# Patient Record
Sex: Male | Born: 1990 | Race: White | Hispanic: No | Marital: Single | Smoking: Current every day smoker
Health system: Southern US, Community
[De-identification: ages and names within clinical notes are randomized; demographics above are authoritative.]

---
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
## 2010-07-04 LAB — URINALYSIS, ROUTINE W REFLEX MICROSCOPIC
Hgb urine dipstick: NEGATIVE
Nitrite: NEGATIVE
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
## 2011-01-22 LAB — DIFFERENTIAL
Basophils Absolute: 0 10*3/uL (ref 0.0–0.1)
Eosinophils Relative: 0 % (ref 0–5)
Lymphocytes Relative: 13 % — ABNORMAL LOW (ref 24–48)
Lymphs Abs: 1.7 10*3/uL (ref 1.1–4.8)
Monocytes Absolute: 0.8 10*3/uL (ref 0.2–1.2)
Monocytes Relative: 6 % (ref 3–11)
Neutro Abs: 10.2 10*3/uL — ABNORMAL HIGH (ref 1.7–8.0)

## 2011-01-22 LAB — BASIC METABOLIC PANEL
BUN: 9 mg/dL (ref 6–23)
Calcium: 9.1 mg/dL (ref 8.4–10.5)
Chloride: 107 mEq/L (ref 96–112)
Creatinine, Ser: 0.77 mg/dL (ref 0.4–1.5)
Glucose, Bld: 88 mg/dL (ref 70–99)
Potassium: 4.1 mEq/L (ref 3.5–5.1)
Sodium: 141 mEq/L (ref 135–145)

## 2011-01-22 LAB — CBC
HCT: 42.1 % (ref 36.0–49.0)
MCHC: 33.7 g/dL (ref 31.0–37.0)
WBC: 12.7 10*3/uL (ref 4.5–13.5)

## 2011-01-22 LAB — TYPE AND SCREEN: ABO/RH(D): A POS

## 2012-01-19 ENCOUNTER — Emergency Department (HOSPITAL_COMMUNITY)
Admission: EM | Admit: 2012-01-19 | Discharge: 2012-01-19 | Disposition: A | Discharge status: Home / Self Care | Payer: Self-pay | Attending: Emergency Medicine | Admitting: Emergency Medicine

## 2012-01-19 ENCOUNTER — Encounter (HOSPITAL_COMMUNITY): Payer: Self-pay | Admitting: Emergency Medicine

## 2012-01-19 DIAGNOSIS — F17200 Nicotine dependence, unspecified, uncomplicated: Secondary | ICD-10-CM | POA: Insufficient documentation

## 2012-01-19 DIAGNOSIS — X500XXA Overexertion from strenuous movement or load, initial encounter: Secondary | ICD-10-CM | POA: Insufficient documentation

## 2012-01-19 DIAGNOSIS — S29019A Strain of muscle and tendon of unspecified wall of thorax, initial encounter: Secondary | ICD-10-CM

## 2012-01-19 DIAGNOSIS — S239XXA Sprain of unspecified parts of thorax, initial encounter: Secondary | ICD-10-CM | POA: Insufficient documentation

## 2012-01-19 MED ORDER — HYDROCODONE-ACETAMINOPHEN 5-325 MG PO TABS: 1.0000 | ORAL_TABLET | Freq: Four times a day (QID) | ORAL | Status: AC | PRN

## 2012-01-19 MED ORDER — CYCLOBENZAPRINE HCL 10 MG PO TABS: ORAL_TABLET | ORAL | Status: DC

## 2012-01-19 MED ORDER — CYCLOBENZAPRINE HCL 10 MG PO TABS
10.0000 mg | ORAL_TABLET | Freq: Once | ORAL | Status: AC
  Administered 2012-01-19: 10 mg via ORAL
  Filled 2012-01-19: qty 1

## 2012-01-19 MED ORDER — IBUPROFEN 800 MG PO TABS
800.0000 mg | ORAL_TABLET | Freq: Once | ORAL | Status: AC
  Administered 2012-01-19: 800 mg via ORAL
  Filled 2012-01-19: qty 1

## 2012-01-19 MED ORDER — HYDROCODONE-ACETAMINOPHEN 5-325 MG PO TABS
1.0000 | ORAL_TABLET | Freq: Once | ORAL | Status: AC
  Administered 2012-01-19: 1 via ORAL
  Filled 2012-01-19: qty 1

## 2012-01-19 NOTE — ED Notes (Signed)
Pt c/o mid-lower back pain that began last night. Pt was working on his car when he bent over and heard a "pop" in his back. Pt states the pain was initially moderate and he "shook it off". Pt states this morning pain was "severe" and becomes worse with movement. Pt describes pain a "sharp" and "spasms".

## 2012-01-19 NOTE — ED Notes (Signed)
Patient states he felt something "pop" when he was working on a car last night. Complaining of back pain in the center of his back.
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
## 2012-01-20 NOTE — ED Provider Notes (Signed)
Medical screening examination/treatment/procedure(s) were performed by non-physician practitioner and as supervising physician I was immediately available for consultation/collaboration.   Gunnard Dorrance W. Eriona Kinchen, MD 01/20/12 1602 

## 2012-07-25 ENCOUNTER — Emergency Department (HOSPITAL_COMMUNITY)
Admission: EM | Admit: 2012-07-25 | Discharge: 2012-07-25 | Discharge status: Against Medical Advice | Payer: Self-pay | Attending: Emergency Medicine | Admitting: Emergency Medicine

## 2012-07-25 ENCOUNTER — Encounter (HOSPITAL_COMMUNITY): Payer: Self-pay | Admitting: Emergency Medicine

## 2012-07-25 DIAGNOSIS — R197 Diarrhea, unspecified: Secondary | ICD-10-CM | POA: Insufficient documentation

## 2012-07-25 LAB — URINE MICROSCOPIC-ADD ON

## 2012-07-25 LAB — URINALYSIS, ROUTINE W REFLEX MICROSCOPIC: Nitrite: NEGATIVE

## 2012-07-25 NOTE — ED Notes (Signed)
PT. REPORTS MULTIPLE EPISODES OF VOMITTING AND DIARRHEA THIS EVENING WITH UPPER ABDOMINAL CRAMPING / HEADACHE.

## 2013-04-11 ENCOUNTER — Emergency Department (HOSPITAL_BASED_OUTPATIENT_CLINIC_OR_DEPARTMENT_OTHER)
Admission: EM | Admit: 2013-04-11 | Discharge: 2013-04-11 | Disposition: A | Discharge status: Home / Self Care | Payer: Self-pay | Attending: Emergency Medicine | Admitting: Emergency Medicine

## 2013-04-11 ENCOUNTER — Encounter (HOSPITAL_BASED_OUTPATIENT_CLINIC_OR_DEPARTMENT_OTHER): Payer: Self-pay | Admitting: Emergency Medicine

## 2013-04-11 DIAGNOSIS — F17200 Nicotine dependence, unspecified, uncomplicated: Secondary | ICD-10-CM | POA: Insufficient documentation

## 2013-04-11 DIAGNOSIS — R21 Rash and other nonspecific skin eruption: Secondary | ICD-10-CM | POA: Insufficient documentation

## 2013-04-11 DIAGNOSIS — L0291 Cutaneous abscess, unspecified: Secondary | ICD-10-CM

## 2013-04-11 DIAGNOSIS — Z792 Long term (current) use of antibiotics: Secondary | ICD-10-CM | POA: Insufficient documentation

## 2013-04-11 DIAGNOSIS — IMO0002 Reserved for concepts with insufficient information to code with codable children: Secondary | ICD-10-CM | POA: Insufficient documentation

## 2013-04-11 LAB — CBC WITH DIFFERENTIAL/PLATELET
Monocytes Absolute: 0.9 10*3/uL (ref 0.1–1.0)
Neutro Abs: 6.5 10*3/uL (ref 1.7–7.7)
Neutrophils Relative %: 66 % (ref 43–77)
RBC: 4.62 MIL/uL (ref 4.22–5.81)
WBC: 9.9 10*3/uL (ref 4.0–10.5)

## 2013-04-11 LAB — BASIC METABOLIC PANEL
Chloride: 100 mEq/L (ref 96–112)
Creatinine, Ser: 0.8 mg/dL (ref 0.50–1.35)
GFR calc non Af Amer: >90 mL/min (ref >90)
Glucose, Bld: 110 mg/dL — ABNORMAL HIGH (ref 70–99)
Potassium: 3.9 mEq/L (ref 3.5–5.1)

## 2013-04-11 MED ORDER — CLINDAMYCIN PHOSPHATE 900 MG/50ML IV SOLN
900.0000 mg | Freq: Once | INTRAVENOUS | Status: AC
  Administered 2013-04-11: 900 mg via INTRAVENOUS
  Filled 2013-04-11: qty 50

## 2013-04-11 MED ORDER — CLINDAMYCIN HCL 150 MG PO CAPS: 300.0000 mg | ORAL_CAPSULE | Freq: Four times a day (QID) | ORAL | Status: DC

## 2013-04-11 MED ORDER — HYDROCODONE-ACETAMINOPHEN 5-325 MG PO TABS: 1.0000 | ORAL_TABLET | Freq: Four times a day (QID) | ORAL | Status: AC | PRN

## 2013-04-11 NOTE — ED Notes (Signed)
Pt has cellulitis to right elbow x 5 days.  Noted redness, swelling, inflammation and tenderness to elbow area.

## 2013-04-11 NOTE — ED Notes (Signed)
Right elbow wound site cleansed and dressed with nonstick dressing and Kling wrap.

## 2013-04-12 NOTE — ED Provider Notes (Signed)
CSN: [id number]     Arrival date & time 04/11/13  1435 History   First MD Initiated Contact with Patient 04/11/13 1528     Chief Complaint  Patient presents with  . Cellulitis   (Consider location/radiation/quality/duration/timing/severity/associated sxs/prior Treatment) HPI  This is a 22 year old male who presents with redness and swelling of the right elbow. Patient states that he noted a pimple over the right elbow eye 5 days ago which she popped. He has had increasing redness and swelling over the elbow and up the right arm. He reports pain with range of motion. He denies any fevers or other symptoms.  No past medical history on file. No past surgical history on file. No family history on file. History  Substance Use Topics  . Smoking status: Current Every Day Smoker  . Smokeless tobacco: Not on file  . Alcohol Use: Yes     Comment: weekends    Review of Systems  Constitutional: Negative.  Negative for fever.  Respiratory: Negative.  Negative for chest tightness and shortness of breath.   Cardiovascular: Negative.  Negative for chest pain.  Gastrointestinal: Negative.  Negative for abdominal pain.  Genitourinary: Negative.  Negative for dysuria.  Skin: Positive for color change, rash and wound.  Neurological: Negative for headaches.  All other systems reviewed and are negative.    Allergies  Review of patient's allergies indicates no known allergies.  Home Medications   Current Outpatient Rx  Name  Route  Sig  Dispense  Refill  . clindamycin (CLEOCIN) 150 MG capsule   Oral   Take 2 capsules (300 mg total) by mouth 4 (four) times daily.   56 capsule   0   . HYDROcodone-acetaminophen (NORCO/VICODIN) 5-325 MG per tablet   Oral   Take 1-2 tablets by mouth every 6 (six) hours as needed.   10 tablet   0    BP 132/80  Pulse 88  Temp(Src) 99.1 F (37.3 C) (Oral)  Resp 18  Ht 5\' 7"  (1.702 m)  Wt 150 lb (68.04 kg)  BMI 23.49 kg/m2  SpO2 99% Physical Exam   Nursing note and vitals reviewed. Constitutional: He is oriented to person, place, and time. He appears well-developed and well-nourished. No distress.  HENT:  Head: Normocephalic and atraumatic.  Cardiovascular: Normal rate, regular rhythm and normal heart sounds.   No murmur heard. Pulmonary/Chest: Effort normal and breath sounds normal. No respiratory distress. He has no wheezes.  Abdominal: Soft. Bowel sounds are normal. There is no tenderness. There is no rebound.  Musculoskeletal: He exhibits no edema.  Focused examination of the right upper extremity reveals a pustule and fluctuation over the posterior aspect of the right elbow with associated erythema, there is mild streaking up the arm proximally, patient has full range of motion  Lymphadenopathy:    He has no cervical adenopathy.  Neurological: He is alert and oriented to person, place, and time.  Skin: Skin is warm and dry.  See musculoskeletal above  Psychiatric: He has a normal mood and affect.    ED Course  INCISION AND DRAINAGE Date/Time: 04/12/2013 2:40 PM Performed by: Ross Marcus, F Authorized by: Ross Marcus, F Consent: Verbal consent obtained. Risks and benefits: risks, benefits and alternatives were discussed Consent given by: patient Patient identity confirmed: verbally with patient Type: abscess Body area: upper extremity Location details: right elbow Anesthesia: local infiltration Local anesthetic: lidocaine 1% with epinephrine Anesthetic total: 5 ml Patient sedated: no Scalpel size: 11 Incision type: single straight Complexity:  simple Drainage: purulent Drainage amount: moderate Wound treatment: drain placed Packing material: 1/2 in iodoform gauze Comments: I&D performed of abscess over the right elbow, 1 cm incision made with evacuation of pus, patient tolerated procedure well, incision was packed   (including critical care time) Labs Review Labs Reviewed  BASIC METABOLIC PANEL -  Abnormal; Notable for the following:    Glucose, Bld 110 (*)    All other components within normal limits  CBC WITH DIFFERENTIAL   Imaging Review No results found.  EKG Interpretation   None       MDM   1. Cellulitis and abscess    Patient presents with an abscess of the right elbow with associated cellulitis. He is otherwise systemically well and nontoxic appearing. Patient was given IV clindamycin given evidence of severe lightheadedness and streaking up the right upper extremity. Abscess was I&D at the bedside with evacuation of pus. Abscess was packed. Have low suspicion at this time for septic joint or other cause of redness at the elbow given obvious abscess outside the joint. Patient was given strict return precautions. He will be given a course of clindamycin for outpatient management of cellulitis. He is to followup at urgent care on Friday for wound recheck. He should followup sooner if he has increasing redness, swelling, or fevers. Patient stated understanding.  After history, exam, and medical workup I feel the patient has been appropriately medically screened and is safe for discharge home. Pertinent diagnoses were discussed with the patient. Patient was given return precautions.    Shon Baton, MD 04/12/13 [id number]

## 2013-08-30 ENCOUNTER — Emergency Department (HOSPITAL_BASED_OUTPATIENT_CLINIC_OR_DEPARTMENT_OTHER)
Admission: EM | Admit: 2013-08-30 | Discharge: 2013-08-30 | Disposition: A | Discharge status: Home / Self Care | Payer: Self-pay | Attending: Emergency Medicine | Admitting: Emergency Medicine

## 2013-08-30 ENCOUNTER — Encounter (HOSPITAL_BASED_OUTPATIENT_CLINIC_OR_DEPARTMENT_OTHER): Payer: Self-pay | Admitting: Emergency Medicine

## 2013-08-30 ENCOUNTER — Emergency Department (HOSPITAL_BASED_OUTPATIENT_CLINIC_OR_DEPARTMENT_OTHER): Payer: Self-pay

## 2013-08-30 DIAGNOSIS — Z79899 Other long term (current) drug therapy: Secondary | ICD-10-CM | POA: Insufficient documentation

## 2013-08-30 DIAGNOSIS — S91319A Laceration without foreign body, unspecified foot, initial encounter: Secondary | ICD-10-CM

## 2013-08-30 DIAGNOSIS — Y9302 Activity, running: Secondary | ICD-10-CM | POA: Insufficient documentation

## 2013-08-30 DIAGNOSIS — W268XXA Contact with other sharp object(s), not elsewhere classified, initial encounter: Secondary | ICD-10-CM | POA: Insufficient documentation

## 2013-08-30 DIAGNOSIS — F17200 Nicotine dependence, unspecified, uncomplicated: Secondary | ICD-10-CM | POA: Insufficient documentation

## 2013-08-30 DIAGNOSIS — S91309A Unspecified open wound, unspecified foot, initial encounter: Secondary | ICD-10-CM | POA: Insufficient documentation

## 2013-08-30 DIAGNOSIS — Z792 Long term (current) use of antibiotics: Secondary | ICD-10-CM | POA: Insufficient documentation

## 2013-08-30 DIAGNOSIS — Y9289 Other specified places as the place of occurrence of the external cause: Secondary | ICD-10-CM | POA: Insufficient documentation

## 2013-08-30 MED ORDER — TETANUS-DIPHTH-ACELL PERTUSSIS 5-2.5-18.5 LF-MCG/0.5 IM SUSP
0.5000 mL | Freq: Once | INTRAMUSCULAR | Status: AC
  Administered 2013-08-30: 0.5 mL via INTRAMUSCULAR
  Filled 2013-08-30: qty 0.5

## 2013-08-30 MED ORDER — CEPHALEXIN 500 MG PO CAPS: 500.0000 mg | ORAL_CAPSULE | Freq: Four times a day (QID) | ORAL | Status: DC

## 2013-08-30 MED ORDER — HYDROCODONE-ACETAMINOPHEN 5-325 MG PO TABS: 1.0000 | ORAL_TABLET | ORAL | Status: AC | PRN

## 2013-08-30 MED ORDER — CEPHALEXIN 250 MG PO CAPS
500.0000 mg | ORAL_CAPSULE | Freq: Once | ORAL | Status: AC
  Administered 2013-08-30: 500 mg via ORAL
  Filled 2013-08-30: qty 2
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
## 2013-08-30 NOTE — Discharge Instructions (Signed)
Laceration Care, Adult °A laceration is a cut or lesion that goes through all layers of the skin and into the tissue just beneath the skin. °TREATMENT  °Some lacerations may not require closure. Some lacerations may not be able to be closed due to an increased risk of infection. It is important to see your caregiver as soon as possible after an injury to minimize the risk of infection and maximize the opportunity for successful closure. °If closure is appropriate, pain medicines may be given, if needed. The wound will be cleaned to help prevent infection. Your caregiver will use stitches (sutures), staples, wound glue (adhesive), or skin adhesive strips to repair the laceration. These tools bring the skin edges together to allow for faster healing and a better cosmetic outcome. However, all wounds will heal with a scar. Once the wound has healed, scarring can be minimized by covering the wound with sunscreen during the day for 1 full year. °HOME CARE INSTRUCTIONS  °For sutures or staples: °· Keep the wound clean and dry. °· If you were given a bandage (dressing), you should change it at least once a day. Also, change the dressing if it becomes wet or dirty, or as directed by your caregiver. °· Wash the wound with soap and water 2 times a day. Rinse the wound off with water to remove all soap. Pat the wound dry with a clean towel. °· After cleaning, apply a thin layer of the antibiotic ointment as recommended by your caregiver. This will help prevent infection and keep the dressing from sticking. °· You may shower as usual after the first 24 hours. Do not soak the wound in water until the sutures are removed. °· Only take over-the-counter or prescription medicines for pain, discomfort, or fever as directed by your caregiver. °· Get your sutures or staples removed as directed by your caregiver. °For skin adhesive strips: °· Keep the wound clean and dry. °· Do not get the skin adhesive strips wet. You may bathe  carefully, using caution to keep the wound dry. °· If the wound gets wet, pat it dry with a clean towel. °· Skin adhesive strips will fall off on their own. You may trim the strips as the wound heals. Do not remove skin adhesive strips that are still stuck to the wound. They will fall off in time. °For wound adhesive: °· You may briefly wet your wound in the shower or bath. Do not soak or scrub the wound. Do not swim. Avoid periods of heavy perspiration until the skin adhesive has fallen off on its own. After showering or bathing, gently pat the wound dry with a clean towel. °· Do not apply liquid medicine, cream medicine, or ointment medicine to your wound while the skin adhesive is in place. This may loosen the film before your wound is healed. °· If a dressing is placed over the wound, be careful not to apply tape directly over the skin adhesive. This may cause the adhesive to be pulled off before the wound is healed. °· Avoid prolonged exposure to sunlight or tanning lamps while the skin adhesive is in place. Exposure to ultraviolet light in the first year will darken the scar. °· The skin adhesive will usually remain in place for 5 to 10 days, then naturally fall off the skin. Do not pick at the adhesive film. °You may need a tetanus shot if: °· You cannot remember when you had your last tetanus shot. °· You have never had a tetanus   shot. If you get a tetanus shot, your arm may swell, get red, and feel warm to the touch. This is common and not a problem. If you need a tetanus shot and you choose not to have one, there is a rare chance of getting tetanus. Sickness from tetanus can be serious. SEEK MEDICAL CARE IF:   You have redness, swelling, or increasing pain in the wound.  You see a red line that goes away from the wound.  You have yellowish-white fluid (pus) coming from the wound.  You have a fever.  You notice a bad smell coming from the wound or dressing.  Your wound breaks open before or  after sutures have been removed.  You notice something coming out of the wound such as wood or glass.  Your wound is on your hand or foot and you cannot move a finger or toe. SEEK IMMEDIATE MEDICAL CARE IF:   Your pain is not controlled with prescribed medicine.  You have severe swelling around the wound causing pain and numbness or a change in color in your arm, hand, leg, or foot.  Your wound splits open and starts bleeding.  You have worsening numbness, weakness, or loss of function of any joint around or beyond the wound.  You develop painful lumps near the wound or on the skin anywhere on your body. MAKE SURE YOU:   Understand these instructions.  Will watch your condition.  Will get help right away if you are not doing well or get worse. Document Released: 04/05/2005 Document Revised: 06/28/2011 Document Reviewed: 09/29/2010 [facility] Patient Information 2014 [place], [place]. Tetanus and Diphtheria Vaccine Your caregiver has suggested that you receive an immunization to prevent tetanus (lockjaw) and diphtheria. Tetanus and diphtheria are serious and deadly infectious diseases of the past that have been nearly wiped out by modern immunizations. Td or DT vaccines (shots) are the immunizations given to help prevent these illnesses. Td is the medical term for a standard tetanus dose, small diphtheria dose. DT means both in standard doses. ABOUT THE DISEASES Tetanus (lockjaw) and diphtheria are serious diseases. Tetanus is caused by a germ that lives in the soil. It enters the body through a cut or wound, often caused by a nail or broken piece of glass. You cannot catch tetanus from another person. Diphtheria spreads when germs pass from an infected person to the nose or throat of others. Tetanus causes serious, painful spasms of all muscles. It can lead to:  "Locking" of the muscles of the jaw and throat, so the patient cannot open his or her mouth or swallow.  Damage to the heart  muscle. Diphtheria causes a thick coating in the nose, throat, or airway. It can lead to:  Breathing problems.  Kidney problems.  Heart failure.  Paralysis.  Death. ABOUT THE VACCINES  A vaccine is a shot (immunization) that can help prevent a disease. Vaccines have helped lower the rates of getting certain diseases. If people stopped getting vaccinated, more people would develop illnesses. These vaccines can be used in three ways:  As catch-up for people who did not get all their doses when they were children.  As a booster dose every 10 years.  For protection against tetanus infection, after a wound. Benefits of the vaccines Vaccination is the best way to protect against tetanus and diphtheria. Because of vaccination, there are fewer cases of these diseases. Cases are rare in children because most get a routine vaccination with DTP (Diphtheria, Tetanus, and Pertussis), DTaP (  Diphtheria, Tetanus, and acellular Pertussis), or DT (Diphtheria and Tetanus) vaccines. There would be many more cases if we stopped vaccinating people. Tetanus kills about 1 in 5 people who are infected. WHEN SHOULD YOU GET TD VACCINE?  Td is made for people 67 years of age and older.  People who have not gotten at least 3 doses of any tetanus and diphtheria vaccine (DTP, DTaP or DT) during their lifetime should do so using Td. After a person gets the third dose, a Td dose is needed every 10 years all through life. This is because protection fades over time. Booster shots are needed every 10 years.  Other vaccines may be given at the same time as Td. You may not know today whether your immunizations are current. The vaccine given today is to protect you from your next cut or injury. It does not offer protection for the current injury. An immune globulin injection may be given, if protection is needed immediately. Check with your caregiver later regarding your immunization status. Tell your caregiver if the person  getting the vaccine:  Has ever had a serious allergic reaction or other problem with Td, or any other tetanus and diphtheria vaccine (DTP, DTaP, or DT). People who have had a serious allergic reaction should not receive the vaccine.  Has epilepsy or another nervous system illness.  Has had Guillain Barre Syndrome (GBS) in the past.  Now has a moderate or severe illness.  Is pregnant.  If you are not sure, ask your caregiver. WHAT ARE THE RISKS FROM TD VACCINE?  As with any medicine, there are very small risks that serious problems, even death, could occur after getting a vaccine. However, the risk of a serious side effect from the vaccine is almost zero.  The risks from the vaccine are much smaller than the risks from the diseases, if people stopped getting vaccinated. Both diseases can cause serious health problems, which are prevented by the vaccine.  Almost all people who get Td have no problems from it. Mild problems If mild problems occur, they usually start within hours to a day or two after vaccination. They may last 1-2 days:  Soreness, redness, or swelling where the shot was given.  Headache or tiredness.  Occasionally, a low grade fever. These problems can be worse in adults who get Td vaccine very often. Non-aspirin medicines may be used to reduce soreness. Severe problems These problems happen very rarely:  Serious allergic reaction (at most, occurs in 1 in 1 million vaccinated persons). This occurs almost immediately, and is treatable with medicines. Signs of a serious allergic reaction include:  Difficulty breathing.  Hoarseness or wheezing.  Hives.  Dizziness.  Deep, aching pain and muscle wasting in upper arm(s). Overall, the benefits to you and your family from these vaccines are far greater than the risk. WHAT TO DO IF THERE IS A SERIOUS REACTION:  Call a caregiver or get the person to a doctor or emergency room right away.  Write down what happened,  the date and time it happened, and tell your caregiver.  Ask your caregiver to file a Vaccine Adverse Event Report form or call, toll-free: [phone] If you want to learn more about this vaccine, ask your caregiver. She/he can give you the vaccine package insert or suggest other sources of information. Also, the [employer] gives compensation (payment) for persons thought to be injured by vaccines. For details call, toll-free: [phone]. Document Released: 04/02/2000 Document Revised:  06/28/2011 Document Reviewed: 02/20/2009 ExitCare Patient Information 2014 [place], [place].

## 2013-08-30 NOTE — ED Notes (Signed)
Injury to right foot in briar patch this afternoon.  Multiple cuts

## 2013-08-30 NOTE — ED Provider Notes (Signed)
Medical screening examination/treatment/procedure(s) were conducted as a shared visit with non-physician practitioner(s) and myself.  I personally evaluated the patient during the encounter.   EKG Interpretation None      Foot laceration, earlier today, loose approximation after irrigation  Charles B. Bernette MayersSheldon, MD 08/30/13 2221

## 2013-08-30 NOTE — ED Notes (Signed)
Food and drink ok by PA [place]

## 2019-06-16 ENCOUNTER — Emergency Department (HOSPITAL_COMMUNITY)
Admission: EM | Admit: 2019-06-16 | Discharge: 2019-06-16 | Disposition: A | Discharge status: Home / Self Care | Payer: Self-pay | Attending: Emergency Medicine | Admitting: Emergency Medicine

## 2019-06-16 ENCOUNTER — Other Ambulatory Visit: Payer: Self-pay

## 2019-06-16 ENCOUNTER — Encounter (HOSPITAL_COMMUNITY): Payer: Self-pay

## 2019-06-16 DIAGNOSIS — T43621A Poisoning by amphetamines, accidental (unintentional), initial encounter: Secondary | ICD-10-CM | POA: Insufficient documentation

## 2019-06-16 DIAGNOSIS — T50901A Poisoning by unspecified drugs, medicaments and biological substances, accidental (unintentional), initial encounter: Secondary | ICD-10-CM

## 2019-06-16 DIAGNOSIS — T407X1A Poisoning by cannabis (derivatives), accidental (unintentional), initial encounter: Secondary | ICD-10-CM | POA: Insufficient documentation

## 2019-06-16 DIAGNOSIS — F17200 Nicotine dependence, unspecified, uncomplicated: Secondary | ICD-10-CM | POA: Insufficient documentation

## 2019-06-16 LAB — COMPREHENSIVE METABOLIC PANEL
ALT: 82 U/L — ABNORMAL HIGH (ref 0–44)
AST: 48 U/L — ABNORMAL HIGH (ref 15–41)
Albumin: 3.9 g/dL (ref 3.5–5.0)
Alkaline Phosphatase: 52 U/L (ref 38–126)
Anion gap: 7 (ref 5–15)
BUN: 13 mg/dL (ref 6–20)
CO2: 28 mmol/L (ref 22–32)
Calcium: 8.6 mg/dL — ABNORMAL LOW (ref 8.9–10.3)
Chloride: 102 mmol/L (ref 98–111)
Creatinine, Ser: 0.92 mg/dL (ref 0.61–1.24)
GFR calc Af Amer: >60 mL/min (ref >60)
GFR calc non Af Amer: >60 mL/min (ref >60)
Glucose, Bld: 98 mg/dL (ref 70–99)
Potassium: 3.5 mmol/L (ref 3.5–5.1)
Sodium: 137 mmol/L (ref 135–145)
Total Bilirubin: 0.6 mg/dL (ref 0.3–1.2)
Total Protein: 7.2 g/dL (ref 6.5–8.1)

## 2019-06-16 LAB — CBC WITH DIFFERENTIAL/PLATELET
Abs Immature Granulocytes: 0.04 10*3/uL (ref 0.00–0.07)
Basophils Absolute: 0.1 10*3/uL (ref 0.0–0.1)
Basophils Relative: 1 %
Eosinophils Absolute: 0.4 10*3/uL (ref 0.0–0.5)
Eosinophils Relative: 4 %
HCT: 44.3 % (ref 39.0–52.0)
Hemoglobin: 14.2 g/dL (ref 13.0–17.0)
Immature Granulocytes: 0 %
Lymphocytes Relative: 25 %
Lymphs Abs: 2.5 10*3/uL (ref 0.7–4.0)
MCH: 31.1 pg (ref 26.0–34.0)
MCHC: 32.1 g/dL (ref 30.0–36.0)
MCV: 96.9 fL (ref 80.0–100.0)
Monocytes Absolute: 0.8 10*3/uL (ref 0.1–1.0)
Monocytes Relative: 8 %
Neutro Abs: 6.3 10*3/uL (ref 1.7–7.7)
Neutrophils Relative %: 62 %
Platelets: 258 10*3/uL (ref 150–400)
RBC: 4.57 MIL/uL (ref 4.22–5.81)
RDW: 11.9 % (ref 11.5–15.5)
WBC: 10.1 10*3/uL (ref 4.0–10.5)
nRBC: 0 % (ref 0.0–0.2)

## 2019-06-16 LAB — SALICYLATE LEVEL: Salicylate Lvl: <7 mg/dL — ABNORMAL LOW (ref 7.0–30.0)

## 2019-06-16 LAB — ETHANOL: Alcohol, Ethyl (B): <10 mg/dL (ref <10)

## 2019-06-16 LAB — RAPID URINE DRUG SCREEN, HOSP PERFORMED
Amphetamines: POSITIVE — AB
Barbiturates: NOT DETECTED
Benzodiazepines: NOT DETECTED
Cocaine: NOT DETECTED
Opiates: NOT DETECTED
Tetrahydrocannabinol: POSITIVE — AB

## 2019-06-16 LAB — ACETAMINOPHEN LEVEL: Acetaminophen (Tylenol), Serum: <10 ug/mL — ABNORMAL LOW (ref 10–30)

## 2019-06-16 MED ORDER — NALOXONE HCL 0.4 MG/ML IJ SOLN: 0.4000 mg | INTRAMUSCULAR | Status: DC | PRN

## 2019-06-16 NOTE — ED Notes (Signed)
ED Provider at bedside. 

## 2019-06-16 NOTE — ED Triage Notes (Signed)
Pt arrives from home via [facility] who were called out for an unresponsive Pt who possibly overdosed on uinknown substance. Heroin was suspected on scene and person in home administered IM Narcan to Pt. [facility] reports upon arrival Pt ambulated and laid on their stretcher without assistance. Pt is lethargic upon arrival to ED but responds to verbal commands.

## 2019-06-16 NOTE — ED Provider Notes (Signed)
[facility] EMERGENCY DEPARTMENT Provider Note   CSN: [id number] Arrival date & time: 06/16/19  0626     History Chief Complaint  Patient presents with  . Drug Overdose    Robert Marks is a 29 y.o. male.  HPI     This is a 29 year old male who presents following an overdose.  Patient is somnolent and does not provide any history.  Per EMS, he was found unresponsive.  A bystander at his house administered IM Narcan.  He became more awake.  Per EMS he is ambulatory on scene.  On my evaluation, he is somnolent minimally arousable.  Level 5 caveat  History reviewed. No pertinent past medical history.  There are no problems to display for this patient.   History reviewed. No pertinent surgical history.     History reviewed. No pertinent family history.  Social History   Tobacco Use  . Smoking status: Current Every Day Smoker    Packs/day: 1.00  Substance Use Topics  . Alcohol use: Yes    Comment: weekends  . Drug use: Yes    Types: Marijuana    Home Medications Prior to Admission medications   Medication Sig Start Date End Date Taking? Authorizing Provider  cephALEXin (KEFLEX) 500 MG capsule Take 1 capsule (500 mg total) by mouth 4 (four) times daily. 08/30/13   Elpidio Anis, PA-C  clindamycin (CLEOCIN) 150 MG capsule Take 2 capsules (300 mg total) by mouth 4 (four) times daily. 04/11/13   Zoriana Oats, Mayer Masker, MD  HYDROcodone-acetaminophen (NORCO/VICODIN) 5-325 MG per tablet Take 1-2 tablets by mouth every 6 (six) hours as needed. 04/11/13   Charleene Callegari, Mayer Masker, MD  HYDROcodone-acetaminophen (NORCO/VICODIN) 5-325 MG per tablet Take 1-2 tablets by mouth every 4 (four) hours as needed. 08/30/13   Elpidio Anis, PA-C    Allergies    Patient has no known allergies.  Review of Systems   Review of Systems  Unable to perform ROS: Mental status change    Physical Exam Updated Vital Signs BP 107/82   Pulse 84   Temp 97.8 F (36.6 C) (Oral)   Resp 11   Ht  1.702 m (5\' 7" )   Wt 79.4 kg   SpO2 100%   BMI 27.41 kg/m   Physical Exam Vitals and nursing note reviewed.  Constitutional:      Appearance: He is well-developed.     Comments: Somnolent, minimally arousable to verbal stimulus  HENT:     Head: Normocephalic and atraumatic.     Nose: Nose normal.     Mouth/Throat:     Mouth: Mucous membranes are moist.  Eyes:     Pupils: Pupils are equal, round, and reactive to light.     Comments: Pupils 2 mm reactive bilaterally  Cardiovascular:     Rate and Rhythm: Normal rate and regular rhythm.     Heart sounds: Normal heart sounds. No murmur.  Pulmonary:     Effort: Pulmonary effort is normal. No respiratory distress.     Breath sounds: Normal breath sounds. No wheezing.     Comments: Occasional wheeze, breathing approximately 10 times per minute Abdominal:     General: Bowel sounds are normal.     Palpations: Abdomen is soft.     Tenderness: There is no abdominal tenderness. There is no rebound.  Musculoskeletal:     Cervical back: Neck supple.     Right lower leg: No edema.     Left lower leg: No edema.  Lymphadenopathy:  Cervical: No cervical adenopathy.  Skin:    General: Skin is warm and dry.  Neurological:     Mental Status: He is alert and oriented to person, place, and time.  Psychiatric:     Comments: Unable to assess     ED Results / Procedures / Treatments   Labs (all labs ordered are listed, but only abnormal results are displayed) Labs Reviewed  COMPREHENSIVE METABOLIC PANEL - Abnormal; Notable for the following components:      Result Value   Calcium 8.6 (*)    AST 48 (*)    ALT 82 (*)    All other components within normal limits  RAPID URINE DRUG SCREEN, HOSP PERFORMED - Abnormal; Notable for the following components:   Amphetamines POSITIVE (*)    Tetrahydrocannabinol POSITIVE (*)    All other components within normal limits  SALICYLATE LEVEL - Abnormal; Notable for the following components:    Salicylate Lvl <1.6 (*)    All other components within normal limits  ACETAMINOPHEN LEVEL - Abnormal; Notable for the following components:   Acetaminophen (Tylenol), Serum <10 (*)    All other components within normal limits  CBC WITH DIFFERENTIAL/PLATELET  ETHANOL    EKG EKG Interpretation  Date/Time:  Saturday June 16 2019 03:27:10 EST Ventricular Rate:  88 PR Interval:    QRS Duration: 100 QT Interval:  392 QTC Calculation: 475 R Axis:   63 Text Interpretation: Sinus rhythm Borderline prolonged QT interval Confirmed by Thayer Jew [phone]) on 06/16/2019 4:06:33 AM   Radiology No results found.  Procedures Procedures (including critical care time)  Medications Ordered in ED Medications  naloxone (NARCAN) injection 0.4 mg (has no administration in time range)    ED Course  I have reviewed the triage vital signs and the nursing notes.  Pertinent labs & imaging results that were available during my care of the patient were reviewed by me and considered in my medical decision making (see chart for details).  Clinical Course as of Jun 15 629  Sat Jun 16, 2019  0630 Per nursing, patient still somnolent.  He is arousable but frequently falls asleep.  Unable to ambulate independently.  We will continue to monitor.  Lab work reviewed and he is positive for amphetamines and marijuana.  Given his response to Narcan, he could have used a synthetic opioid that does not show up in UDS.   [CH]    Clinical Course User Index [CH] Brad Lieurance, Barbette Hair, MD   MDM Rules/Calculators/A&P                       Patient presents with concerns for overdose.  He did have a response to Narcan.  Patient does not provide any history.  He is arousable and his respiratory rate is 8-10.  Do not feel he needs repeat Narcan at this time but does need close monitoring.  EKG without arrhythmia or ischemia.  Lab work obtained.  Alcohol level negative.  Salicylate and Tylenol level negative.  UDS  positive for amphetamines and marijuana.  Unclear what he overdosed on but his response to Narcan would suggest an opiate that may not show up in his urine.  See clinical course above.  On multiple rechecks, he is sleeping and remains arousable but is not yet ambulatory.  We will continue to monitor.    Final Clinical Impression(s) / ED Diagnoses Final diagnoses:  Accidental drug overdose, initial encounter    Rx / DC Orders ED  Discharge Orders    None       Shon Baton, MD 06/16/19 [phone]

## 2019-06-16 NOTE — ED Notes (Signed)
Pt has been stimulated several time since 7 am.  Pt is arousal and follows command.  However, pt remains drowsy.

## 2019-06-16 NOTE — ED Notes (Signed)
Pt taken off of Oxygen via Nasal Cannula and O2 Sats remain in high 90's. Pt difficult to arouse from sleep but did wake up long enough to take a sip of water before falling back a sleep. Pt did not have any complications with swallowing fluids but is still unable to get up and ambulate at this time.

## 2019-06-16 NOTE — ED Notes (Signed)
This nurse entered the room and the patient was snoring. Vital signs are stable.

## 2019-06-16 NOTE — Discharge Instructions (Signed)
Stop using drugs.  You will end up accidentally kill yourself.  Follow-up at [facility]

## 2019-06-16 NOTE — ED Notes (Signed)
Someone named Morrie Sheldon called to check on Pts discharge status. Her call back number is [phone] if Pt requires someone to give him a ride home following discharge from [facility].

## 2021-03-23 ENCOUNTER — Emergency Department (HOSPITAL_COMMUNITY)
Admission: EM | Admit: 2021-03-23 | Discharge: 2021-03-24 | Disposition: A | Discharge status: Home / Self Care | Payer: Self-pay | Attending: Physician Assistant | Admitting: Physician Assistant

## 2021-03-23 ENCOUNTER — Other Ambulatory Visit: Payer: Self-pay

## 2021-03-23 DIAGNOSIS — Z5321 Procedure and treatment not carried out due to patient leaving prior to being seen by health care provider: Secondary | ICD-10-CM | POA: Insufficient documentation

## 2021-03-23 DIAGNOSIS — L98419 Non-pressure chronic ulcer of buttock with unspecified severity: Secondary | ICD-10-CM | POA: Insufficient documentation

## 2021-03-23 DIAGNOSIS — R1110 Vomiting, unspecified: Secondary | ICD-10-CM | POA: Insufficient documentation

## 2021-03-23 LAB — CBC WITH DIFFERENTIAL/PLATELET
Abs Immature Granulocytes: 0.03 10*3/uL (ref 0.00–0.07)
Basophils Absolute: 0.1 10*3/uL (ref 0.0–0.1)
Basophils Relative: 1 %
Eosinophils Absolute: 0.5 10*3/uL (ref 0.0–0.5)
Eosinophils Relative: 5 %
HCT: 40.6 % (ref 39.0–52.0)
Hemoglobin: 13.6 g/dL (ref 13.0–17.0)
Immature Granulocytes: 0 %
Lymphocytes Relative: 31 %
Lymphs Abs: 2.8 10*3/uL (ref 0.7–4.0)
MCH: 31 pg (ref 26.0–34.0)
MCHC: 33.5 g/dL (ref 30.0–36.0)
MCV: 92.5 fL (ref 80.0–100.0)
Monocytes Absolute: 0.8 10*3/uL (ref 0.1–1.0)
Monocytes Relative: 8 %
Neutro Abs: 5 10*3/uL (ref 1.7–7.7)
Neutrophils Relative %: 55 %
Platelets: 235 10*3/uL (ref 150–400)
RBC: 4.39 MIL/uL (ref 4.22–5.81)
RDW: 11.7 % (ref 11.5–15.5)
WBC: 9 10*3/uL (ref 4.0–10.5)
nRBC: 0 % (ref 0.0–0.2)

## 2021-03-23 LAB — LIPASE, BLOOD: Lipase: 34 U/L (ref 11–51)

## 2021-03-23 LAB — COMPREHENSIVE METABOLIC PANEL
ALT: 43 U/L (ref 0–44)
AST: 34 U/L (ref 15–41)
Albumin: 3.8 g/dL (ref 3.5–5.0)
Alkaline Phosphatase: 52 U/L (ref 38–126)
Anion gap: 8 (ref 5–15)
BUN: 10 mg/dL (ref 6–20)
CO2: 27 mmol/L (ref 22–32)
Calcium: 9.2 mg/dL (ref 8.9–10.3)
Chloride: 104 mmol/L (ref 98–111)
Creatinine, Ser: 0.77 mg/dL (ref 0.61–1.24)
GFR, Estimated: >60 mL/min (ref >60)
Glucose, Bld: 100 mg/dL — ABNORMAL HIGH (ref 70–99)
Potassium: 3.3 mmol/L — ABNORMAL LOW (ref 3.5–5.1)
Sodium: 139 mmol/L (ref 135–145)
Total Bilirubin: 0.6 mg/dL (ref 0.3–1.2)
Total Protein: 7.2 g/dL (ref 6.5–8.1)

## 2021-03-23 NOTE — ED Triage Notes (Signed)
Pt reports blood in emesis and stool x 1 week. Denies abdominal pain. Reports normal stool and emesis that just has tinges of blood. Also c/o and sores on buttocks that are bleeding. Does not know how long sores have been there, he "just noticed them when I scratched my a** last week."

## 2021-03-23 NOTE — ED Provider Notes (Signed)
Emergency Medicine Provider Triage Evaluation Note  Robert Marks , a 30 y.o. male  was evaluated in triage.  Pt complains of hematemesis. Has had nv x1 week. Also has some blood in stool. Denies etoh use.  Review of Systems  Positive: Hematemesis nv,  bloody stool Negative: Abd pain  Physical Exam  BP 129/90 (BP Location: Left Arm)   Pulse 82   Temp 98.2 F (36.8 C) (Oral)   Resp 14   SpO2 97%  Gen:   Awake, no distress   Resp:  Normal effort  MSK:   Moves extremities without difficulty   Medical Decision Making  Medically screening exam initiated at 6:04 PM.  Appropriate orders placed.  Robert Marks was informed that the remainder of the evaluation will be completed by another provider, this initial triage assessment does not replace that evaluation, and the importance of remaining in the ED until their evaluation is complete.     Robert Marks 03/23/21 1804    Jacalyn Lefevre, MD 03/23/21 [phone]

## 2021-03-23 NOTE — ED Notes (Signed)
Called x3 for vitals, no answer. 

## 2021-03-23 NOTE — ED Notes (Signed)
Called patient x3 for vitals, no answer 

## 2021-06-12 ENCOUNTER — Emergency Department (HOSPITAL_COMMUNITY): Payer: No Typology Code available for payment source

## 2021-06-12 ENCOUNTER — Emergency Department (HOSPITAL_COMMUNITY)
Admission: EM | Admit: 2021-06-12 | Discharge: 2021-06-12 | Disposition: A | Discharge status: Home / Self Care | Payer: No Typology Code available for payment source | Attending: Emergency Medicine | Admitting: Emergency Medicine

## 2021-06-12 DIAGNOSIS — S89301D Unspecified physeal fracture of lower end of right fibula, subsequent encounter for fracture with routine healing: Secondary | ICD-10-CM | POA: Insufficient documentation

## 2021-06-12 DIAGNOSIS — M7989 Other specified soft tissue disorders: Secondary | ICD-10-CM | POA: Insufficient documentation

## 2021-06-12 DIAGNOSIS — R000 Tachycardia, unspecified: Secondary | ICD-10-CM | POA: Diagnosis not present

## 2021-06-12 DIAGNOSIS — S82201D Unspecified fracture of shaft of right tibia, subsequent encounter for closed fracture with routine healing: Secondary | ICD-10-CM

## 2021-06-12 DIAGNOSIS — M79661 Pain in right lower leg: Secondary | ICD-10-CM | POA: Insufficient documentation

## 2021-06-12 DIAGNOSIS — S82302D Unspecified fracture of lower end of left tibia, subsequent encounter for closed fracture with routine healing: Secondary | ICD-10-CM | POA: Diagnosis not present

## 2021-06-12 DIAGNOSIS — F17200 Nicotine dependence, unspecified, uncomplicated: Secondary | ICD-10-CM | POA: Diagnosis not present

## 2021-06-12 LAB — CBC WITH DIFFERENTIAL/PLATELET
Abs Immature Granulocytes: 0.06 10*3/uL (ref 0.00–0.07)
Basophils Absolute: 0.1 10*3/uL (ref 0.0–0.1)
Basophils Relative: 0 %
Eosinophils Absolute: 0.4 10*3/uL (ref 0.0–0.5)
Eosinophils Relative: 3 %
HCT: 40.1 % (ref 39.0–52.0)
Hemoglobin: 13.3 g/dL (ref 13.0–17.0)
Immature Granulocytes: 1 %
Lymphocytes Relative: 20 %
Lymphs Abs: 2.3 10*3/uL (ref 0.7–4.0)
MCH: 30.9 pg (ref 26.0–34.0)
MCHC: 33.2 g/dL (ref 30.0–36.0)
MCV: 93 fL (ref 80.0–100.0)
Monocytes Absolute: 0.7 10*3/uL (ref 0.1–1.0)
Monocytes Relative: 6 %
Neutro Abs: 8.2 10*3/uL — ABNORMAL HIGH (ref 1.7–7.7)
Neutrophils Relative %: 70 %
Platelets: 518 10*3/uL — ABNORMAL HIGH (ref 150–400)
RBC: 4.31 MIL/uL (ref 4.22–5.81)
RDW: 11.9 % (ref 11.5–15.5)
WBC: 11.7 10*3/uL — ABNORMAL HIGH (ref 4.0–10.5)
nRBC: 0 % (ref 0.0–0.2)

## 2021-06-12 LAB — BASIC METABOLIC PANEL
Anion gap: 10 (ref 5–15)
BUN: 17 mg/dL (ref 6–20)
CO2: 26 mmol/L (ref 22–32)
Calcium: 9.1 mg/dL (ref 8.9–10.3)
Chloride: 102 mmol/L (ref 98–111)
Creatinine, Ser: 0.77 mg/dL (ref 0.61–1.24)
GFR, Estimated: >60 mL/min (ref >60)
Glucose, Bld: 109 mg/dL — ABNORMAL HIGH (ref 70–99)
Potassium: 4.1 mmol/L (ref 3.5–5.1)
Sodium: 138 mmol/L (ref 135–145)

## 2021-06-12 LAB — LACTIC ACID, PLASMA: Lactic Acid, Venous: 1.9 mmol/L (ref 0.5–1.9)

## 2021-06-12 MED ORDER — CEPHALEXIN 500 MG PO CAPS: 500.0000 mg | ORAL_CAPSULE | Freq: Four times a day (QID) | ORAL | 0 refills | Status: AC

## 2021-06-12 MED ORDER — CEPHALEXIN 250 MG PO CAPS
1000.0000 mg | ORAL_CAPSULE | Freq: Once | ORAL | Status: AC
  Administered 2021-06-12: 1000 mg via ORAL
  Filled 2021-06-12: qty 4

## 2021-06-12 MED ORDER — SULFAMETHOXAZOLE-TRIMETHOPRIM 800-160 MG PO TABS
1.0000 | ORAL_TABLET | Freq: Once | ORAL | Status: AC
  Administered 2021-06-12: 1 via ORAL
  Filled 2021-06-12: qty 1

## 2021-06-12 MED ORDER — MORPHINE SULFATE (PF) 4 MG/ML IV SOLN
4.0000 mg | Freq: Once | INTRAVENOUS | Status: AC
  Administered 2021-06-12: 4 mg via INTRAVENOUS
  Filled 2021-06-12: qty 1

## 2021-06-12 MED ORDER — SULFAMETHOXAZOLE-TRIMETHOPRIM 800-160 MG PO TABS: 1.0000 | ORAL_TABLET | Freq: Two times a day (BID) | ORAL | 0 refills | Status: AC

## 2021-06-12 MED ORDER — ONDANSETRON HCL 4 MG/2ML IJ SOLN
4.0000 mg | Freq: Once | INTRAMUSCULAR | Status: AC
  Administered 2021-06-12: 4 mg via INTRAVENOUS
  Filled 2021-06-12: qty 2

## 2021-06-12 MED ORDER — SODIUM CHLORIDE 0.9 % IV BOLUS
1000.0000 mL | Freq: Once | INTRAVENOUS | Status: AC
  Administered 2021-06-12: 1000 mL via INTRAVENOUS
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
## 2021-06-12 NOTE — Discharge Instructions (Addendum)
Call Dr. Jan Fireman and schedule follow-up appointment, I would call him this morning and let them know you are seen in the ED and schedule follow-up evaluation for later this afternoon.  You are given a dose of Bactrim and Keflex here in the ED.  I have printed the paper prescriptions, you can use good [facility] to find the cheapest options. Using good [facility] the Keflex should be $9 at [facility] and bactrim is 13.77.
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
## 2021-06-17 LAB — CULTURE, BLOOD (ROUTINE X 2)
Culture: NO GROWTH
Culture: NO GROWTH
Special Requests: ADEQUATE
Special Requests: ADEQUATE
